# Patient Record
Sex: Male | Born: 1995
Health system: Southern US, Community
[De-identification: ages and names within clinical notes are randomized; demographics above are authoritative.]

## PROBLEM LIST (undated history)

## (undated) DIAGNOSIS — E119 Type 2 diabetes mellitus without complications: Secondary | ICD-10-CM

## (undated) DIAGNOSIS — F840 Autistic disorder: Secondary | ICD-10-CM

## (undated) DIAGNOSIS — H52209 Unspecified astigmatism, unspecified eye: Secondary | ICD-10-CM

## (undated) DIAGNOSIS — E785 Hyperlipidemia, unspecified: Secondary | ICD-10-CM

## (undated) DIAGNOSIS — F8189 Other developmental disorders of scholastic skills: Secondary | ICD-10-CM

## (undated) DIAGNOSIS — L709 Acne, unspecified: Secondary | ICD-10-CM

## (undated) DIAGNOSIS — K0510 Chronic gingivitis, plaque induced: Secondary | ICD-10-CM

## (undated) DIAGNOSIS — N62 Hypertrophy of breast: Secondary | ICD-10-CM

## (undated) DIAGNOSIS — H5210 Myopia, unspecified eye: Secondary | ICD-10-CM

## (undated) DIAGNOSIS — E559 Vitamin D deficiency, unspecified: Secondary | ICD-10-CM

---
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
## 2017-05-19 ENCOUNTER — Encounter (HOSPITAL_COMMUNITY): Payer: Self-pay

## 2017-05-19 ENCOUNTER — Emergency Department (HOSPITAL_COMMUNITY)
Admission: EM | Admit: 2017-05-19 | Discharge: 2017-05-20 | Disposition: A | Discharge status: Home / Self Care | Payer: Medicaid Other | Attending: Emergency Medicine | Admitting: Emergency Medicine

## 2017-05-19 ENCOUNTER — Other Ambulatory Visit: Payer: Self-pay

## 2017-05-19 DIAGNOSIS — Z5321 Procedure and treatment not carried out due to patient leaving prior to being seen by health care provider: Secondary | ICD-10-CM | POA: Insufficient documentation

## 2017-05-19 DIAGNOSIS — K5900 Constipation, unspecified: Secondary | ICD-10-CM | POA: Insufficient documentation

## 2017-05-19 HISTORY — DX: Vitamin D deficiency, unspecified: E55.9

## 2017-05-19 HISTORY — DX: Myopia, unspecified eye: H52.10

## 2017-05-19 HISTORY — DX: Acne, unspecified: L70.9

## 2017-05-19 HISTORY — DX: Hyperlipidemia, unspecified: E78.5

## 2017-05-19 HISTORY — DX: Hypertrophy of breast: N62

## 2017-05-19 HISTORY — DX: Unspecified astigmatism, unspecified eye: H52.209

## 2017-05-19 HISTORY — DX: Chronic gingivitis, plaque induced: K05.10

## 2017-05-19 NOTE — ED Triage Notes (Addendum)
Patient is from a group home. [place] [facility]. Patient was taking a stool softener and was having diarrhea. Group home staff reports that since stopping the stool softener the patient has not had a BM in 5 days. Patient is nonverbal.

## 2017-05-19 NOTE — ED Notes (Signed)
Called Pt in lobby x2 with no response.

## 2017-05-19 NOTE — ED Notes (Signed)
Called pt in lobby no response x2. 

## 2017-05-19 NOTE — ED Notes (Signed)
Called Pt to be roomed, no response x1 

## 2017-05-20 ENCOUNTER — Emergency Department (HOSPITAL_COMMUNITY): Payer: Medicaid Other

## 2017-05-20 ENCOUNTER — Encounter (HOSPITAL_COMMUNITY): Payer: Self-pay | Admitting: Emergency Medicine

## 2017-05-20 ENCOUNTER — Emergency Department (HOSPITAL_COMMUNITY)
Admission: EM | Admit: 2017-05-20 | Discharge: 2017-05-20 | Disposition: A | Discharge status: Home / Self Care | Payer: Medicaid Other | Attending: Emergency Medicine | Admitting: Emergency Medicine

## 2017-05-20 DIAGNOSIS — E119 Type 2 diabetes mellitus without complications: Secondary | ICD-10-CM | POA: Diagnosis not present

## 2017-05-20 DIAGNOSIS — K5900 Constipation, unspecified: Secondary | ICD-10-CM | POA: Insufficient documentation

## 2017-05-20 DIAGNOSIS — Z79899 Other long term (current) drug therapy: Secondary | ICD-10-CM | POA: Diagnosis not present

## 2017-05-20 MED ORDER — POLYETHYLENE GLYCOL 3350 17 GM/SCOOP PO POWD: 17.0000 g | Freq: Every day | ORAL | 0 refills | Status: AC

## 2017-05-20 NOTE — ED Triage Notes (Signed)
Pt brought in by caregiver who reports that patient has had BM since Saturday. Reports that has been on stool softeners for 6 days. They came yesterday and waited over 5 hours but pt became aggressive so they had to leave.

## 2017-05-20 NOTE — ED Provider Notes (Signed)
[facility]-EMERGENCY DEPT Provider Note   CSN: [id number] Arrival date & time: 05/20/17  8295     History   Chief Complaint Chief Complaint  Patient presents with  . Constipation   Level 5 caveat: Nonverbal, autism  HPI Ralph Adams is a 22 y.o. male.  HPI Patient is a 22 year old male presents to the emergency department under the care of his group home provider with complaints of no bowel movement in the past 5-6 days.  Patient is on docusate sodium without improvement.  Continues to eat normally.  Continues to drink normally.  No vomiting reported.  His activity has been normal.  Patient is unable to provide any history.   Past Medical History:  Diagnosis Date  . Acne   . Autism   . Developmental non-verbal disorder   . Diabetes (HCC)   . Dyslipidemia   . Gingivitis   . Gynecomastia   . Myopic astigmatism   . Vitamin D deficiency     Patient Active Problem List   Diagnosis Date Noted  . 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 Tachycardia 11/26/2014  . Agitation 11/26/2014  . Aggressive behavior     History reviewed. No pertinent surgical history.     Home Medications    Prior to Admission medications   Medication Sig Start Date End Date Taking? Authorizing Provider  ARIPiprazole (ABILIFY) 5 MG tablet Take 5 mg by mouth daily. TAKE AT 730 AM   Yes [provider]  Cholecalciferol (VITAMIN D3 PO) Take 2,000 tablets by mouth every morning.   Yes [provider]  Docusate Calcium (STOOL SOFTENER PO) Take 1 capsule by mouth 2 (two) times daily.   Yes [provider]  loratadine (CLARITIN) 10 MG tablet Take 10 mg by mouth daily.   Yes [provider]  Salicylic Acid (ACNE WASH EX) Apply topically. SCRUB AFFECTED AREA(S) OF FACE TWICE DAILY   Yes [provider]  cloNIDine (CATAPRES) 0.1 MG tablet Take 1 tablet (0.1 mg total) by mouth 2 (two) times daily. Patient not taking:  Reported on 05/20/2017 11/27/14   Antoine Primas, MD  OLANZapine zydis (ZYPREXA ZYDIS) 5 MG disintegrating tablet Take 1 tablet (5 mg total) by mouth 2 (two) times daily. Patient not taking: Reported on 05/20/2017 06/19/15   Audry Pili, PA-C  polyethylene glycol powder (MIRALAX) powder Take 17 g by mouth daily. 05/20/17   Azalia Bilis, MD  polyethylene glycol powder (MIRALAX) powder Take 17 g by mouth daily. 05/20/17   Azalia Bilis, MD  risperiDONE (RISPERDAL) 0.5 MG tablet Take 1 tablet (0.5 mg total) by mouth 2 (two) times daily. Patient not taking: Reported on 05/20/2017 06/19/15   Leta Baptist, MD  risperiDONE (RISPERDAL) 0.5 MG tablet Take 1 tablet (0.5 mg total) by mouth 2 (two) times daily. Patient not taking: Reported on 05/20/2017 08/27/15   Barrett, Rolm Gala, PA-C  risperiDONE (RISPERDAL) 0.5 MG tablet Take 1 tablet (0.5 mg total) by mouth 2 (two) times daily. Patient not taking: Reported on 05/20/2017 09/01/15   Doug Sou, MD    Family History No family history on file.  Social History Social History   Tobacco Use  . Smoking status: Never Smoker  . Smokeless tobacco: Never Used  Substance Use Topics  . Alcohol use: No  . Drug use: No     Allergies   Milk-related compounds   Review of Systems Review of Systems  All other systems reviewed and are negative.    Physical Exam  Updated Vital Signs BP 125/84   Pulse 84   Temp 98.4 F (36.9 C) (Oral)   Resp 18   SpO2 100%   Physical Exam  Constitutional: He appears well-developed and well-nourished.  HENT:  Head: Normocephalic.  Eyes: EOM are normal.  Neck: Normal range of motion.  Pulmonary/Chest: Effort normal.  Abdominal: He exhibits no distension. There is no tenderness.  Musculoskeletal: He exhibits no deformity.  Neurological: He is alert.  Psychiatric: He has a normal mood and affect.  Nursing note and vitals reviewed.    ED Treatments / Results  Labs (all labs ordered are listed, but only  abnormal results are displayed) Labs Reviewed - No data to display  EKG  EKG Interpretation None       Radiology Dg Abdomen 1 View  Result Date: 05/20/2017 CLINICAL DATA:  No bowel movement for 1 week, initial encounter EXAM: ABDOMEN - 1 VIEW COMPARISON:  None. FINDINGS: Scattered large and small bowel gas is noted. Mild retained fecal material is noted although no significant obstructive change is seen. No bony abnormality is noted. No free air is seen. IMPRESSION: No definitive obstruction or constipation. Electronically Signed   By: Alcide CleverMark  Lukens M.D.   On: 05/20/2017 11:05    Procedures Procedures (including critical care time)  Medications Ordered in ED Medications - No data to display   Initial Impression / Assessment and Plan / ED Course  I have reviewed the triage vital signs and the nursing notes.  Pertinent labs & imaging results that were available during my care of the patient were reviewed by me and considered in my medical decision making (see chart for details).     Home with [facility].  Primary care follow-up.  Final Clinical Impressions(s) / ED Diagnoses   Final diagnoses:  Constipation, unspecified constipation type    ED Discharge Orders        Ordered    polyethylene glycol powder (MIRALAX) powder  Daily     05/20/17 1156    polyethylene glycol powder (MIRALAX) powder  Daily     05/20/17 1205       Azalia Bilisampos, Kamry Faraci, MD 05/20/17 1206

## 2017-05-20 NOTE — ED Notes (Signed)
Patient transported to X-ray 

## 2017-09-06 ENCOUNTER — Emergency Department (HOSPITAL_COMMUNITY): Payer: Medicaid Other

## 2017-09-06 ENCOUNTER — Encounter (HOSPITAL_COMMUNITY): Payer: Self-pay

## 2017-09-06 ENCOUNTER — Emergency Department (HOSPITAL_COMMUNITY)
Admission: EM | Admit: 2017-09-06 | Discharge: 2017-09-06 | Disposition: A | Discharge status: Home / Self Care | Payer: Medicaid Other | Attending: Emergency Medicine | Admitting: Emergency Medicine

## 2017-09-06 ENCOUNTER — Other Ambulatory Visit: Payer: Self-pay

## 2017-09-06 DIAGNOSIS — E785 Hyperlipidemia, unspecified: Secondary | ICD-10-CM | POA: Diagnosis not present

## 2017-09-06 DIAGNOSIS — S01111A Laceration without foreign body of right eyelid and periocular area, initial encounter: Secondary | ICD-10-CM | POA: Insufficient documentation

## 2017-09-06 DIAGNOSIS — W0110XA Fall on same level from slipping, tripping and stumbling with subsequent striking against unspecified object, initial encounter: Secondary | ICD-10-CM | POA: Diagnosis not present

## 2017-09-06 DIAGNOSIS — E119 Type 2 diabetes mellitus without complications: Secondary | ICD-10-CM | POA: Insufficient documentation

## 2017-09-06 DIAGNOSIS — S0181XA Laceration without foreign body of other part of head, initial encounter: Secondary | ICD-10-CM

## 2017-09-06 DIAGNOSIS — Y9289 Other specified places as the place of occurrence of the external cause: Secondary | ICD-10-CM | POA: Insufficient documentation

## 2017-09-06 DIAGNOSIS — Y9389 Activity, other specified: Secondary | ICD-10-CM | POA: Insufficient documentation

## 2017-09-06 DIAGNOSIS — Z23 Encounter for immunization: Secondary | ICD-10-CM | POA: Insufficient documentation

## 2017-09-06 DIAGNOSIS — Y998 Other external cause status: Secondary | ICD-10-CM | POA: Diagnosis not present

## 2017-09-06 DIAGNOSIS — S0990XA Unspecified injury of head, initial encounter: Secondary | ICD-10-CM | POA: Diagnosis not present

## 2017-09-06 DIAGNOSIS — Z79899 Other long term (current) drug therapy: Secondary | ICD-10-CM | POA: Insufficient documentation

## 2017-09-06 MED ORDER — LORAZEPAM 2 MG/ML IJ SOLN
2.0000 mg | Freq: Once | INTRAMUSCULAR | Status: AC
  Administered 2017-09-06: 2 mg via INTRAMUSCULAR
  Filled 2017-09-06: qty 1

## 2017-09-06 MED ORDER — HALOPERIDOL LACTATE 5 MG/ML IJ SOLN
5.0000 mg | Freq: Once | INTRAMUSCULAR | Status: AC
  Administered 2017-09-06: 5 mg via INTRAMUSCULAR
  Filled 2017-09-06: qty 1

## 2017-09-06 MED ORDER — LIDOCAINE-EPINEPHRINE-TETRACAINE (LET) SOLUTION
3.0000 mL | Freq: Once | NASAL | Status: AC
Start: 2017-09-06 — End: 2017-09-06
  Administered 2017-09-06: 3 mL via TOPICAL
  Filled 2017-09-06: qty 3

## 2017-09-06 MED ORDER — LIDOCAINE-EPINEPHRINE 2 %-1:100000 IJ SOLN
20.0000 mL | Freq: Once | INTRAMUSCULAR | Status: AC
  Administered 2017-09-06: 20 mL
  Filled 2017-09-06: qty 20

## 2017-09-06 MED ORDER — TETANUS-DIPHTH-ACELL PERTUSSIS 5-2.5-18.5 LF-MCG/0.5 IM SUSP
0.5000 mL | Freq: Once | INTRAMUSCULAR | Status: AC
  Administered 2017-09-06: 0.5 mL via INTRAMUSCULAR
  Filled 2017-09-06: qty 0.5

## 2017-09-06 NOTE — Discharge Instructions (Signed)
Take tylenol, motrin for pain.   Continue your current meds.   See your doctor   Return to ER if you have another laceration, fall, headaches, vomiting.
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
## 2017-09-06 NOTE — ED Notes (Signed)
TRIAGE AND SCREEN QUESTIONS ANSWERED BY [facility]. [facility]  MOTHER IS LEGAL GUARDIAN ROSEMARY Ferrebee

## 2017-09-06 NOTE — ED Notes (Signed)
MEDICATION AT BEDSIDE 

## 2017-09-06 NOTE — ED Notes (Signed)
Patient transported to CT 
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
## 2017-09-19 ENCOUNTER — Emergency Department (HOSPITAL_COMMUNITY)
Admission: EM | Admit: 2017-09-19 | Discharge: 2017-09-19 | Disposition: A | Discharge status: Home / Self Care | Payer: Medicaid Other | Attending: Emergency Medicine | Admitting: Emergency Medicine

## 2017-09-19 ENCOUNTER — Encounter (HOSPITAL_COMMUNITY): Payer: Self-pay | Admitting: *Deleted

## 2017-09-19 ENCOUNTER — Other Ambulatory Visit: Payer: Self-pay

## 2017-09-19 DIAGNOSIS — Z79899 Other long term (current) drug therapy: Secondary | ICD-10-CM | POA: Diagnosis not present

## 2017-09-19 DIAGNOSIS — F840 Autistic disorder: Secondary | ICD-10-CM | POA: Diagnosis not present

## 2017-09-19 DIAGNOSIS — Z5189 Encounter for other specified aftercare: Secondary | ICD-10-CM

## 2017-09-19 DIAGNOSIS — H1031 Unspecified acute conjunctivitis, right eye: Secondary | ICD-10-CM | POA: Insufficient documentation

## 2017-09-19 MED ORDER — SULFAMETHOXAZOLE-TRIMETHOPRIM 800-160 MG PO TABS: 1.0000 | ORAL_TABLET | Freq: Two times a day (BID) | ORAL | 0 refills | Status: AC

## 2017-09-19 MED FILL — SULFAMETHOXAZOLE-TMP DS TAB: 800-160 | 7 days supply | Qty: 14 | Fill #0

## 2017-09-19 NOTE — ED Provider Notes (Signed)
[facility]-EMERGENCY DEPT Provider Note   CSN: [id number] Arrival date & time: 09/19/17  1010     History   Chief Complaint Chief Complaint  Patient presents with  . Wound Check    HPI Ralph Adams is a 22 y.o. male.  The history is provided by the patient. No language interpreter was used.  Wound Check  This is a recurrent problem. The current episode started more than 1 week ago. The problem occurs constantly. The problem has been gradually worsening. Nothing aggravates the symptoms. Nothing relieves the symptoms. He has tried nothing for the symptoms.  Pt was seen for a facial laceration on 7/2.  Pt has been picking at wound and at corner of eye.  Pt has drainage and redness from the corner of his eye.   Past Medical History:  Diagnosis Date  . Acne   . Autism   . Developmental non-verbal disorder   . Diabetes (HCC)   . Dyslipidemia   . Gingivitis   . Gynecomastia   . Myopic astigmatism   . Vitamin D deficiency     Patient Active Problem List   Diagnosis Date Noted  . 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 Tachycardia 11/26/2014  . Agitation 11/26/2014  . Aggressive behavior     History reviewed. No pertinent surgical history.      Home Medications    Prior to Admission medications   Medication Sig Start Date End Date Taking? Authorizing Provider  Cholecalciferol (VITAMIN D3 PO) Take 2,000 tablets by mouth every morning.    [provider]  cloNIDine (CATAPRES) 0.1 MG tablet Take 1 tablet (0.1 mg total) by mouth 2 (two) times daily. Patient not taking: Reported on 05/20/2017 11/27/14   Antoine Primas, MD  loratadine (CLARITIN) 10 MG tablet Take 10 mg by mouth daily.    [provider]  OLANZapine zydis (ZYPREXA ZYDIS) 5 MG disintegrating tablet Take 1 tablet (5 mg total) by mouth 2 (two) times daily. Patient not taking: Reported on 05/20/2017 06/19/15   Audry Pili, PA-C  polyethylene glycol powder  (MIRALAX) powder Take 17 g by mouth daily. Patient not taking: Reported on 09/06/2017 05/20/17   Azalia Bilis, MD  polyethylene glycol powder [name]) powder Take 17 g by mouth daily. 05/20/17   Azalia Bilis, MD  QUEtiapine (SEROQUEL XR) 300 MG 24 hr tablet Take 300 mg by mouth at bedtime.    [provider]  risperiDONE (RISPERDAL) 0.5 MG tablet Take 1 tablet (0.5 mg total) by mouth 2 (two) times daily. Patient not taking: Reported on 05/20/2017 06/19/15   Leta Baptist, MD  risperiDONE (RISPERDAL) 0.5 MG tablet Take 1 tablet (0.5 mg total) by mouth 2 (two) times daily. Patient not taking: Reported on 05/20/2017 08/27/15   Barrett, Rolm Gala, PA-C  risperiDONE (RISPERDAL) 0.5 MG tablet Take 1 tablet (0.5 mg total) by mouth 2 (two) times daily. Patient not taking: Reported on 05/20/2017 09/01/15   Doug Sou, MD  Salicylic Acid (ACNE [facility] EX) Apply topically. SCRUB AFFECTED AREA(S) OF FACE TWICE DAILY    [provider]  sulfamethoxazole-trimethoprim (BACTRIM DS,SEPTRA DS) 800-160 MG tablet Take 1 tablet by mouth 2 (two) times daily for 7 days. 09/19/17 09/26/17  Elson Areas, PA-C    Family History No family history on file.  Social History Social History   Tobacco Use  . Smoking status: Never Smoker  . Smokeless tobacco: Never Used  Substance Use Topics  . Alcohol use: No  . Drug  use: No     Allergies   Milk-related compounds   Review of Systems Review of Systems  Eyes: Positive for discharge and redness.  Skin: Positive for wound.       Scabbed laceration above eye  All other systems reviewed and are negative. Pt picking nose during exam.    Physical Exam Updated Vital Signs BP 125/90 (BP Location: Right Arm)   Pulse (!) 125   Resp 20   Ht 5\' 7"  (1.702 m)   Wt 66.7 kg (147 lb)   SpO2 99%   BMI 23.02 kg/m   Physical Exam   ED Treatments / Results  Labs (all labs ordered are listed, but only abnormal results are displayed) Labs Reviewed -  No data to display  EKG None  Radiology No results found.  Procedures Procedures (including critical care time)  Medications Ordered in ED Medications - No data to display   Initial Impression / Assessment and Plan / ED Course  I have reviewed the triage vital signs and the nursing notes.  Pertinent labs & imaging results that were available during my care of the patient were reviewed by me and considered in my medical decision making (see chart for details).     Staff reports pt can not tolerate eye drop  Pt given rx for bactrim An After Visit Summary was printed and given to the patient.   Final Clinical Impressions(s) / ED Diagnoses   Final diagnoses:  Visit for wound check  Acute conjunctivitis of right eye, unspecified acute conjunctivitis type    ED Discharge Orders        Ordered    sulfamethoxazole-trimethoprim (BACTRIM DS,SEPTRA DS) 800-160 MG tablet  2 times daily     09/19/17 1031       Osie CheeksSofia, Esmirna Ravan K, PA-C 09/19/17 1052    Lorre NickAllen, Anthony, MD 09/20/17 [phone]

## 2017-09-19 NOTE — Discharge Instructions (Signed)
Return if any problems.

## 2017-09-19 NOTE — ED Notes (Signed)
ED Provider at bedside. 

## 2017-09-19 NOTE — ED Notes (Signed)
Bed: WTR5 Expected date:  Expected time:  Means of arrival:  Comments: 

## 2017-09-19 NOTE — ED Triage Notes (Signed)
Pt had sutures placed a couple of weeks ago, now has scabs where he picks at area, care giver concerned

## 2017-12-14 ENCOUNTER — Other Ambulatory Visit: Payer: Self-pay

## 2017-12-14 ENCOUNTER — Emergency Department (HOSPITAL_COMMUNITY)
Admission: EM | Admit: 2017-12-14 | Discharge: 2017-12-14 | Disposition: A | Discharge status: Home / Self Care | Payer: Medicaid Other | Attending: Emergency Medicine | Admitting: Emergency Medicine

## 2017-12-14 ENCOUNTER — Encounter (HOSPITAL_COMMUNITY): Payer: Self-pay | Admitting: Emergency Medicine

## 2017-12-14 DIAGNOSIS — R21 Rash and other nonspecific skin eruption: Secondary | ICD-10-CM | POA: Insufficient documentation

## 2017-12-14 DIAGNOSIS — E119 Type 2 diabetes mellitus without complications: Secondary | ICD-10-CM | POA: Diagnosis not present

## 2017-12-14 DIAGNOSIS — R4689 Other symptoms and signs involving appearance and behavior: Secondary | ICD-10-CM | POA: Insufficient documentation

## 2017-12-14 DIAGNOSIS — F840 Autistic disorder: Secondary | ICD-10-CM | POA: Insufficient documentation

## 2017-12-14 DIAGNOSIS — Z79899 Other long term (current) drug therapy: Secondary | ICD-10-CM | POA: Diagnosis not present

## 2017-12-14 LAB — CBC WITH DIFFERENTIAL/PLATELET
Abs Immature Granulocytes: 0.02 10*3/uL (ref 0.00–0.07)
Basophils Absolute: 0.1 10*3/uL (ref 0.0–0.1)
Basophils Relative: 1 %
EOS ABS: 0.3 10*3/uL (ref 0.0–0.5)
EOS PCT: 3 %
HEMATOCRIT: 43.9 % (ref 39.0–52.0)
HEMOGLOBIN: 14.5 g/dL (ref 13.0–17.0)
Immature Granulocytes: 0 %
LYMPHS ABS: 3.1 10*3/uL (ref 0.7–4.0)
LYMPHS PCT: 34 %
MCH: 32.4 pg (ref 26.0–34.0)
MCHC: 33 g/dL (ref 30.0–36.0)
MCV: 98 fL (ref 80.0–100.0)
MONO ABS: 0.7 10*3/uL (ref 0.1–1.0)
Monocytes Relative: 8 %
NRBC: 0 % (ref 0.0–0.2)
Neutro Abs: 5 10*3/uL (ref 1.7–7.7)
Neutrophils Relative %: 54 %
Platelets: 204 10*3/uL (ref 150–400)
RBC: 4.48 MIL/uL (ref 4.22–5.81)
RDW: 11.9 % (ref 11.5–15.5)
WBC: 9.1 10*3/uL (ref 4.0–10.5)

## 2017-12-14 LAB — COMPREHENSIVE METABOLIC PANEL
ALK PHOS: 113 U/L (ref 38–126)
ALT: 31 U/L (ref 0–44)
ANION GAP: 10 (ref 5–15)
AST: 25 U/L (ref 15–41)
Albumin: 4.4 g/dL (ref 3.5–5.0)
BILIRUBIN TOTAL: 0.6 mg/dL (ref 0.3–1.2)
BUN: 13 mg/dL (ref 6–20)
CALCIUM: 9.5 mg/dL (ref 8.9–10.3)
CO2: 27 mmol/L (ref 22–32)
Chloride: 101 mmol/L (ref 98–111)
Creatinine, Ser: 0.81 mg/dL (ref 0.61–1.24)
Glucose, Bld: 89 mg/dL (ref 70–99)
POTASSIUM: 3.4 mmol/L — AB (ref 3.5–5.1)
Sodium: 138 mmol/L (ref 135–145)
TOTAL PROTEIN: 7.5 g/dL (ref 6.5–8.1)

## 2017-12-14 LAB — ETHANOL

## 2017-12-14 LAB — SALICYLATE LEVEL

## 2017-12-14 LAB — ACETAMINOPHEN LEVEL

## 2017-12-14 MED ORDER — DIPHENHYDRAMINE HCL 25 MG PO CAPS
50.0000 mg | ORAL_CAPSULE | Freq: Once | ORAL | Status: AC
  Administered 2017-12-14: 50 mg via ORAL
  Filled 2017-12-14: qty 2

## 2017-12-14 MED ORDER — LORAZEPAM 2 MG/ML IJ SOLN
2.0000 mg | Freq: Once | INTRAMUSCULAR | Status: AC
  Administered 2017-12-14: 2 mg via INTRAMUSCULAR
  Filled 2017-12-14: qty 1

## 2017-12-14 NOTE — ED Notes (Addendum)
Pt. Was given a ham sandwich along with 2 cups of water. Pt. brief was changed due to being soiled. Nurse aware.

## 2017-12-14 NOTE — ED Notes (Signed)
Prior to giving Ativan, patient became agitated. Attempted to throw shoes, and leave the room. Attempted to redirect patient back to room and remain calm.

## 2017-12-14 NOTE — ED Notes (Signed)
Bed: WA23 Expected date:  Expected time:  Means of arrival:  Comments: Triage 7 

## 2017-12-14 NOTE — ED Notes (Addendum)
Patient became agitated and ran out of lobby front door out into road after trying to escort him to a room. Patient convinced to go to room 23 after offering pizza per the caregivers request. Asked if patient had allergies twice, caregiver responded "NO". Pizza given. Afterwards caregiver stated that patient was allergic to milk products. Patient ate one slice, security was able to take the other slice.

## 2017-12-14 NOTE — ED Triage Notes (Addendum)
Rash on face post taking gabapentin 300 mg; started taken medication yesterday. Pt nonverbal; since start has been combative.  Pt swatting at monitoring equipment; unable to obtain VS. Pt NAD.

## 2017-12-14 NOTE — ED Notes (Signed)
Upon being escorted to room, pt became increasingly agitated and ran out of lobby door. Pt is with PD and caregiver outside. Pt has a steady gate

## 2017-12-14 NOTE — ED Provider Notes (Signed)
[facility]-EMERGENCY DEPT Provider Note   CSN: [id number] Arrival date & time: 12/14/17  1247     History   Chief Complaint Chief Complaint  Patient presents with  . Allergic Reaction    HPI Ralph Adams is a 22 y.o. male with h/o autism non verbal brought to ER by group home care giver, Shon Hale, for evaluation of rash and aggressive behavior.  Level 5 caveat due to psychiatric and non verbal developmental disorder.  Per caregiver, pt developed rash around noon today.  Rash is red, dry and pt has been itching and smacking himself in the face.  He became agitated at the same time, kicking things, smacking himself and being uncooperative with staff.  Pt began taking gabapentin yesterday, new medication. Caregiver denies recent fevers, changes in chronic cough, changes in BM.    HPI  Past Medical History:  Diagnosis Date  . Acne   . Autism   . Developmental non-verbal disorder   . Diabetes (HCC)   . Dyslipidemia   . Gingivitis   . Gynecomastia   . Myopic astigmatism   . Vitamin D deficiency     Patient Active Problem List   Diagnosis Date Noted  . 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 Tachycardia 11/26/2014  . Agitation 11/26/2014  . Aggressive behavior     History reviewed. No pertinent surgical history.      Home Medications    Prior to Admission medications   Medication Sig Start Date End Date Taking? Authorizing Provider  Cholecalciferol (VITAMIN D3 PO) Take 2,000 tablets by mouth every morning.   Yes [provider]  clomiPRAMINE (ANAFRANIL) 50 MG capsule Take 50 mg by mouth at bedtime.   Yes [provider]  gabapentin (NEURONTIN) 300 MG capsule Take 300 mg by mouth 2 (two) times daily.   Yes [provider]  loratadine (CLARITIN) 10 MG tablet Take 10 mg by mouth daily.   Yes [provider]  polyethylene glycol powder (MIRALAX) powder Take 17 g by mouth daily. 05/20/17  Yes Azalia Bilis,  MD  propranolol (INDERAL) 40 MG tablet Take 40 mg by mouth 2 (two) times daily.   Yes [provider]  QUEtiapine (SEROQUEL XR) 400 MG 24 hr tablet Take 300 mg by mouth at bedtime.    Yes [provider]  Salicylic Acid (ACNE WASH EX) Apply topically. SCRUB AFFECTED AREA(S) OF FACE TWICE DAILY   Yes [provider]  cloNIDine (CATAPRES) 0.1 MG tablet Take 1 tablet (0.1 mg total) by mouth 2 (two) times daily. Patient not taking: Reported on 12/14/2017 11/27/14   Antoine Primas, MD  OLANZapine zydis (ZYPREXA ZYDIS) 5 MG disintegrating tablet Take 1 tablet (5 mg total) by mouth 2 (two) times daily. Patient not taking: Reported on 12/14/2017 06/19/15   Audry Pili, PA-C  polyethylene glycol powder (MIRALAX) powder Take 17 g by mouth daily. Patient not taking: Reported on 12/14/2017 05/20/17   Azalia Bilis, MD  risperiDONE (RISPERDAL) 0.5 MG tablet Take 1 tablet (0.5 mg total) by mouth 2 (two) times daily. Patient not taking: Reported on 12/14/2017 06/19/15   Leta Baptist, MD  risperiDONE (RISPERDAL) 0.5 MG tablet Take 1 tablet (0.5 mg total) by mouth 2 (two) times daily. Patient not taking: Reported on 12/14/2017 08/27/15   Barrett, Rolm Gala, PA-C  risperiDONE (RISPERDAL) 0.5 MG tablet Take 1 tablet (0.5 mg total) by mouth 2 (two) times daily. Patient not taking: Reported on 12/14/2017 09/01/15   Doug Sou, MD  Family History No family history on file.  Social History Social History   Tobacco Use  . Smoking status: Never Smoker  . Smokeless tobacco: Never Used  Substance Use Topics  . Alcohol use: No  . Drug use: No     Allergies   Lactose intolerance (gi) and Milk-related compounds   Review of Systems Review of Systems  Unable to perform ROS: Patient nonverbal  Skin: Positive for rash.     Physical Exam Updated Vital Signs BP (!) 143/89   Pulse (!) 110   Resp 15   SpO2 98%   Physical Exam  Constitutional: He is oriented to person, place, and  time. He appears well-developed and well-nourished. No distress.  Pacing back and forth in the room. Needs frequent redirection but cooperates with exam.   HENT:  Head: Normocephalic and atraumatic.  Right Ear: External ear normal.  Left Ear: External ear normal.  Nose: Nose normal.  No nasal mucosal edema. No obvious soft palette, uvula, oropharynx, tonsil, tongue, lip or facial angioedema.  MMM. No intraoral lesions.   Eyes: Conjunctivae and EOM are normal.  Neck: Normal range of motion. Neck supple.  Cardiovascular: Normal rate, regular rhythm and normal heart sounds.  Pulmonary/Chest: Effort normal and breath sounds normal.  Musculoskeletal: Normal range of motion. He exhibits no deformity.  Neurological: He is alert and oriented to person, place, and time.  Skin: Skin is warm and dry. Capillary refill takes less than 2 seconds.  Dry, papular, erythematous rash to central face with excoriations and dry blood.  No rash to anterior/posterior thorax, abdomen, upper or lower extremities.   Psychiatric: He has a normal mood and affect. His behavior is normal. Judgment and thought content normal.  Nursing note and vitals reviewed.    ED Treatments / Results  Labs (all labs ordered are listed, but only abnormal results are displayed) Labs Reviewed  COMPREHENSIVE METABOLIC PANEL - Abnormal; Notable for the following components:      Result Value   Potassium 3.4 (*)    All other components within normal limits  ACETAMINOPHEN LEVEL - Abnormal; Notable for the following components:   Acetaminophen (Tylenol), Serum <10 (*)    All other components within normal limits  CBC WITH DIFFERENTIAL/PLATELET  ETHANOL  SALICYLATE LEVEL    EKG None  Radiology No results found.  Procedures Procedures (including critical care time)  Medications Ordered in ED Medications  diphenhydrAMINE (BENADRYL) capsule 50 mg (50 mg Oral Given 12/14/17 1443)  LORazepam (ATIVAN) injection 2 mg (2 mg  Intramuscular Given 12/14/17 1435)     Initial Impression / Assessment and Plan / ED Course  I have reviewed the triage vital signs and the nursing notes.  Pertinent labs & imaging results that were available during my care of the patient were reviewed by me and considered in my medical decision making (see chart for details).  Clinical Course as of Dec 14 1625  Wed Dec 14, 2017  1615 Re-evaluated pt.  Rash has not worsened or spread.  Throat exam unchanged. VSS.  Caregiver states pt has been more calm and cooperative, eating and drinking.    [CG]    Clinical Course User Index [CG] Liberty Handy, PA-C   22 yo here with dry, papular, erythematous, central face rash after recent initiation of gabapentin.  Followed with agitation, aggression, smacking himself in the face.  Rash is most likely contact dermatitis vs reaction from gabapentin.  He keeps rubbing his nose and mouth during exam.  Pt was given benadryl and observed in ER without worsening rash or other signs of severe allergic reaction, anaphylaxis.  No intraoral lesions. NO recent antibiotics. Dermatological emergency such as EM, SJS considered unlike.y He is tolerating PO. He has been more calm. Screening labs normal. VSS.  Caregiver agrees he is more calm as well.  I think rash is itchy and pt is bothered by it, this may have triggered behavioral changes.  Caregiver denies infectious symptoms, WBC normal.  Lungs CTAB. No recent diarrhea, vomiting.  Rash does not appear to be cellulitic.  I think pt is appropriate for discharge.  Recommended dc gabapentin and f/u with psychiatrist for re-evaluation and medication adjustment.  Recommended benadryl PO, benadryl vs hydrocortisone cream, moisturizer and mild facial cleanser. Discussed specific return precautions with pt's caregiver.    Final Clinical Impressions(s) / ED Diagnoses   Final diagnoses:  Facial rash  Aggressive behavior of adult    ED Discharge Orders    None         Liberty Handy, PA-C 12/14/17 1627    Wynetta Fines, MD 12/14/17 [phone]

## 2017-12-14 NOTE — Discharge Instructions (Addendum)
You were seen in the ER for a facial rash and aggression.  The rash may have been due to a topical allergen and/or reaction to gabapentin.  Take Benadryl 25 mg every 6-8 hours for the next 3 days to help with rash and itching.  You can apply Benadryl cream and hydrocortisone cream to the rash to help with itching.  Avoid irritating facial cleansers. You can purchase Cerave Gentle cream cleanser to use daily.   Return to the ER if there is facial, eyelid, lip, tongue swelling, vomiting, diarrhea, difficulty breathing, worsening rash.

## 2017-12-14 NOTE — ED Notes (Signed)
Bed: WTR7 Expected date:  Expected time:  Means of arrival:  Comments: 

## 2018-01-10 ENCOUNTER — Emergency Department (HOSPITAL_COMMUNITY)
Admission: EM | Admit: 2018-01-10 | Discharge: 2018-01-12 | Disposition: A | Discharge status: Home / Self Care | Payer: Medicaid Other | Attending: Emergency Medicine | Admitting: Emergency Medicine

## 2018-01-10 ENCOUNTER — Encounter (HOSPITAL_COMMUNITY): Payer: Self-pay | Admitting: *Deleted

## 2018-01-10 DIAGNOSIS — F73 Profound intellectual disabilities: Secondary | ICD-10-CM | POA: Insufficient documentation

## 2018-01-10 DIAGNOSIS — Z046 Encounter for general psychiatric examination, requested by authority: Secondary | ICD-10-CM

## 2018-01-10 DIAGNOSIS — F840 Autistic disorder: Secondary | ICD-10-CM | POA: Insufficient documentation

## 2018-01-10 DIAGNOSIS — E119 Type 2 diabetes mellitus without complications: Secondary | ICD-10-CM | POA: Diagnosis not present

## 2018-01-10 DIAGNOSIS — Z79899 Other long term (current) drug therapy: Secondary | ICD-10-CM | POA: Insufficient documentation

## 2018-01-10 DIAGNOSIS — R456 Violent behavior: Secondary | ICD-10-CM | POA: Diagnosis present

## 2018-01-10 DIAGNOSIS — R4689 Other symptoms and signs involving appearance and behavior: Secondary | ICD-10-CM | POA: Diagnosis present

## 2018-01-10 DIAGNOSIS — F329 Major depressive disorder, single episode, unspecified: Secondary | ICD-10-CM | POA: Diagnosis not present

## 2018-01-10 DIAGNOSIS — X838XXA Intentional self-harm by other specified means, initial encounter: Secondary | ICD-10-CM | POA: Diagnosis not present

## 2018-01-10 NOTE — ED Notes (Signed)
Bed: WA31 Expected date:  Expected time:  Means of arrival:  Comments: 

## 2018-01-10 NOTE — ED Provider Notes (Signed)
[facility]-EMERGENCY DEPT Provider Note   CSN: [id number] Arrival date & time: 01/10/18  2216     History   Chief Complaint Chief Complaint  Patient presents with  . IVC  . Aggressive Behavior    HPI Ralph Adams is a 22 y.o. male.  The history is provided by medical records and the police.     Level 5 caveat: Patient nonverbal  22 year old male with history of autism, developmental disorder, diabetes, dyslipidemia, vitamin D D deficiency, astigmatism, presenting to the ED under IVC petition by his group home.  Patient has apparently been disruptive at the group home and has been wiping his feces all over the kitchen, hitting other residents of the group home and destroying some property.  Past Medical History:  Diagnosis Date  . Acne   . Autism   . Developmental non-verbal disorder   . Diabetes (HCC)   . Dyslipidemia   . Gingivitis   . Gynecomastia   . Myopic astigmatism   . Vitamin D deficiency     Patient Active Problem List   Diagnosis Date Noted  . 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 Tachycardia 11/26/2014  . Agitation 11/26/2014  . Aggressive behavior     History reviewed. No pertinent surgical history.      Home Medications    Prior to Admission medications   Medication Sig Start Date End Date Taking? Authorizing Provider  Cholecalciferol (VITAMIN D3 PO) Take 2,000 tablets by mouth every morning.    [provider]  clomiPRAMINE (ANAFRANIL) 50 MG capsule Take 50 mg by mouth at bedtime.    [provider]  cloNIDine (CATAPRES) 0.1 MG tablet Take 1 tablet (0.1 mg total) by mouth 2 (two) times daily. Patient not taking: Reported on 12/14/2017 11/27/14   Antoine Primas, MD  gabapentin (NEURONTIN) 300 MG capsule Take 300 mg by mouth 2 (two) times daily.    [provider]  loratadine (CLARITIN) 10 MG tablet Take 10 mg by mouth daily.    [provider]  OLANZapine zydis (ZYPREXA  ZYDIS) 5 MG disintegrating tablet Take 1 tablet (5 mg total) by mouth 2 (two) times daily. Patient not taking: Reported on 12/14/2017 06/19/15   Audry Pili, PA-C  polyethylene glycol powder (MIRALAX) powder Take 17 g by mouth daily. 05/20/17   Azalia Bilis, MD  polyethylene glycol powder (MIRALAX) powder Take 17 g by mouth daily. Patient not taking: Reported on 12/14/2017 05/20/17   Azalia Bilis, MD  propranolol (INDERAL) 40 MG tablet Take 40 mg by mouth 2 (two) times daily.    [provider]  QUEtiapine (SEROQUEL XR) 400 MG 24 hr tablet Take 300 mg by mouth at bedtime.     [provider]  risperiDONE (RISPERDAL) 0.5 MG tablet Take 1 tablet (0.5 mg total) by mouth 2 (two) times daily. Patient not taking: Reported on 12/14/2017 06/19/15   Leta Baptist, MD  risperiDONE (RISPERDAL) 0.5 MG tablet Take 1 tablet (0.5 mg total) by mouth 2 (two) times daily. Patient not taking: Reported on 12/14/2017 08/27/15   Barrett, Rolm Gala, PA-C  risperiDONE (RISPERDAL) 0.5 MG tablet Take 1 tablet (0.5 mg total) by mouth 2 (two) times daily. Patient not taking: Reported on 12/14/2017 09/01/15   Doug Sou, MD  Salicylic Acid (ACNE [facility] EX) Apply topically. SCRUB AFFECTED AREA(S) OF FACE TWICE DAILY    [provider]    Family History No family history on file.  Social History Social History   Tobacco  Use  . Smoking status: Never Smoker  . Smokeless tobacco: Never Used  Substance Use Topics  . Alcohol use: No  . Drug use: No     Allergies   Lactose intolerance (gi) and Milk-related compounds   Review of Systems Review of Systems  Unable to perform ROS: Patient nonverbal     Physical Exam Updated Vital Signs BP 92/65 (BP Location: Right Arm)   Pulse (!) 52   Temp (!) 97.4 F (36.3 C) (Axillary)   Resp 16   SpO2 100%   Physical Exam  Constitutional: He appears well-developed and well-nourished.  Sitting on bed, watching cartoons, calm  HENT:  Head:  Normocephalic and atraumatic.  Mouth/Throat: Oropharynx is clear and moist.  Eyes: Pupils are equal, round, and reactive to light. Conjunctivae and EOM are normal.  Neck: Normal range of motion.  Cardiovascular: Normal rate, regular rhythm and normal heart sounds.  Pulmonary/Chest: Effort normal and breath sounds normal.  Abdominal: Soft. Bowel sounds are normal.  Musculoskeletal: Normal range of motion.  Neurological: He is alert.  Awake, moving extremities well, ambulatory with steady gait, does not provide any verbal responses to questions, follows limited commands  Skin: Skin is warm and dry.  Psychiatric: He has a normal mood and affect.  Nursing note and vitals reviewed.    ED Treatments / Results  Labs (all labs ordered are listed, but only abnormal results are displayed) Labs Reviewed  CBC WITH DIFFERENTIAL/PLATELET - Abnormal; Notable for the following components:      Result Value   RBC 4.09 (*)    All other components within normal limits  COMPREHENSIVE METABOLIC PANEL - Abnormal; Notable for the following components:   Potassium 3.4 (*)    BUN 21 (*)    All other components within normal limits  ACETAMINOPHEN LEVEL - Abnormal; Notable for the following components:   Acetaminophen (Tylenol), Serum <10 (*)    All other components within normal limits  SALICYLATE LEVEL  ETHANOL  RAPID URINE DRUG SCREEN, HOSP PERFORMED    EKG None  Radiology No results found.  Procedures Procedures (including critical care time)  Medications Ordered in ED Medications - No data to display   Initial Impression / Assessment and Plan / ED Course  I have reviewed the triage vital signs and the nursing notes.  Pertinent labs & imaging results that were available during my care of the patient were reviewed by me and considered in my medical decision making (see chart for details).  22 y.o. Judie Petit here under IVC petitioned by group home.  Apparently patient with some aggressive  behavior there and has been involved in several altercations.  He is non-verbal and does not provide any additional history for me.  He is not aggressive, was calm during examination.  Vitals stable.  Labs reassuring.  Patient medically cleared.  TTS evaluation pending.  TTS has evaluated, recommends overnight observation and reassessment by psychiatry in the morning.  Home meds ordered.  Patient has remained stable.    Final Clinical Impressions(s) / ED Diagnoses   Final diagnoses:  Involuntary commitment    ED Discharge Orders    None       Garlon Hatchet, PA-C 01/11/18 1610    Raeford Razor, MD 01/20/18 [phone]

## 2018-01-10 NOTE — ED Triage Notes (Signed)
Pt brought in by [employer] under IVC d/t aggressive behavior @ the group home.  Per IVC papers, pt has been wiping feces all over the kitchen, hitting other residents and destroying property.

## 2018-01-11 ENCOUNTER — Other Ambulatory Visit: Payer: Self-pay

## 2018-01-11 LAB — CBC WITH DIFFERENTIAL/PLATELET
Abs Immature Granulocytes: 0.02 10*3/uL (ref 0.00–0.07)
Basophils Absolute: 0.1 10*3/uL (ref 0.0–0.1)
Basophils Relative: 1 %
EOS ABS: 0.2 10*3/uL (ref 0.0–0.5)
EOS PCT: 2 %
HEMATOCRIT: 39.2 % (ref 39.0–52.0)
HEMOGLOBIN: 13.1 g/dL (ref 13.0–17.0)
Immature Granulocytes: 0 %
LYMPHS ABS: 3.9 10*3/uL (ref 0.7–4.0)
LYMPHS PCT: 42 %
MCH: 32 pg (ref 26.0–34.0)
MCHC: 33.4 g/dL (ref 30.0–36.0)
MCV: 95.8 fL (ref 80.0–100.0)
MONO ABS: 0.6 10*3/uL (ref 0.1–1.0)
MONOS PCT: 7 %
NRBC: 0 % (ref 0.0–0.2)
Neutro Abs: 4.5 10*3/uL (ref 1.7–7.7)
Neutrophils Relative %: 48 %
Platelets: 177 10*3/uL (ref 150–400)
RBC: 4.09 MIL/uL — ABNORMAL LOW (ref 4.22–5.81)
RDW: 12.3 % (ref 11.5–15.5)
WBC: 9.3 10*3/uL (ref 4.0–10.5)

## 2018-01-11 LAB — ACETAMINOPHEN LEVEL: Acetaminophen (Tylenol), Serum: <10 ug/mL — ABNORMAL LOW (ref 10–30)

## 2018-01-11 LAB — COMPREHENSIVE METABOLIC PANEL
ALK PHOS: 109 U/L (ref 38–126)
ALT: 26 U/L (ref 0–44)
ANION GAP: 8 (ref 5–15)
AST: 21 U/L (ref 15–41)
Albumin: 4 g/dL (ref 3.5–5.0)
BILIRUBIN TOTAL: 0.6 mg/dL (ref 0.3–1.2)
BUN: 21 mg/dL — ABNORMAL HIGH (ref 6–20)
CO2: 27 mmol/L (ref 22–32)
Calcium: 9.2 mg/dL (ref 8.9–10.3)
Chloride: 102 mmol/L (ref 98–111)
Creatinine, Ser: 0.95 mg/dL (ref 0.61–1.24)
GFR calc Af Amer: >60 mL/min (ref >60)
GLUCOSE: 90 mg/dL (ref 70–99)
POTASSIUM: 3.4 mmol/L — AB (ref 3.5–5.1)
Sodium: 137 mmol/L (ref 135–145)
TOTAL PROTEIN: 6.8 g/dL (ref 6.5–8.1)

## 2018-01-11 LAB — SALICYLATE LEVEL: Salicylate Lvl: <7 mg/dL (ref 2.8–30.0)

## 2018-01-11 LAB — ETHANOL: Alcohol, Ethyl (B): <10 mg/dL (ref <10)

## 2018-01-11 MED ORDER — VITAMIN D3 25 MCG (1000 UNIT) PO TABS
2000.0000 [IU] | ORAL_TABLET | Freq: Every morning | ORAL | Status: DC
  Administered 2018-01-11 – 2018-01-12 (×2): 2000 [IU] via ORAL
  Filled 2018-01-11 (×2): qty 2

## 2018-01-11 MED ORDER — CLOMIPRAMINE HCL 25 MG PO CAPS
50.0000 mg | ORAL_CAPSULE | Freq: Every day | ORAL | Status: DC
  Administered 2018-01-11: 50 mg via ORAL
  Filled 2018-01-11: qty 2

## 2018-01-11 MED ORDER — ZIPRASIDONE MESYLATE 20 MG IM SOLR
10.0000 mg | Freq: Once | INTRAMUSCULAR | Status: DC
  Filled 2018-01-11: qty 20

## 2018-01-11 MED ORDER — DIPHENHYDRAMINE HCL 25 MG PO CAPS
25.0000 mg | ORAL_CAPSULE | Freq: Three times a day (TID) | ORAL | Status: DC
  Administered 2018-01-11 – 2018-01-12 (×4): 25 mg via ORAL
  Filled 2018-01-11 (×4): qty 1

## 2018-01-11 MED ORDER — PROPRANOLOL HCL 40 MG PO TABS
40.0000 mg | ORAL_TABLET | Freq: Two times a day (BID) | ORAL | Status: DC
  Administered 2018-01-11 – 2018-01-12 (×3): 40 mg via ORAL
  Filled 2018-01-11 (×3): qty 1

## 2018-01-11 MED ORDER — QUETIAPINE FUMARATE ER 200 MG PO TB24
400.0000 mg | ORAL_TABLET | Freq: Two times a day (BID) | ORAL | Status: DC
  Administered 2018-01-11 – 2018-01-12 (×3): 400 mg via ORAL
  Filled 2018-01-11 (×3): qty 2

## 2018-01-11 MED ORDER — LORATADINE 10 MG PO TABS
10.0000 mg | ORAL_TABLET | Freq: Every day | ORAL | Status: DC
  Administered 2018-01-11 – 2018-01-12 (×2): 10 mg via ORAL
  Filled 2018-01-11 (×2): qty 1

## 2018-01-11 MED ORDER — ZIPRASIDONE MESYLATE 20 MG IM SOLR
10.0000 mg | Freq: Once | INTRAMUSCULAR | Status: AC
  Administered 2018-01-11: 10 mg via INTRAMUSCULAR

## 2018-01-11 MED ORDER — POLYETHYLENE GLYCOL 3350 17 G PO PACK
17.0000 g | PACK | Freq: Every day | ORAL | Status: DC
  Administered 2018-01-11 – 2018-01-12 (×2): 17 g via ORAL
  Filled 2018-01-11 (×2): qty 1

## 2018-01-11 MED ORDER — STERILE WATER FOR INJECTION IJ SOLN
INTRAMUSCULAR | Status: AC
  Filled 2018-01-11: qty 10

## 2018-01-11 NOTE — BH Assessment (Signed)
[facility] Assessment Progress Note  Per Juanetta Beets, DO, this pt requires psychiatric hospitalization at a facility providing specialty services for IDD patients at this time.  Pt presents under IVC initiated by pt's group home staff, which Dr Sharma Covert has upheld.  This Clinical research associate called [facility] and spoke to [place], who reports that no beds are available on their IDD unit today.  Pt will remain at [facility] pending placement.  Archie Balboa, LCSW agrees to contact pt's guardian and facility to update them on pt's disposition.  Pt's nurse has been notified.  Doylene Canning Behavioral Health Coordinator [phone]

## 2018-01-11 NOTE — Progress Notes (Signed)
CSW aware patient is being recommended for psychiatric inpatient treatment. CSW attempted to reach out to patient's legal guardian, Cassell Voorhies [phone], regarding patient disposition. CSW left voicemail for return call. CSW also attempted to reach out to patient's IDD care coordinator, Val Eagle [phone]. CSW also left voicemail for return call.  CSW spoke with Juline Patch, Director of [facility] [phone]. CSW explained that patient currently meets criteria for inpatient psych treatment. CSW explained that if while patient is awaiting placement, patient stabilizes then he may be discharged back home before placement is found.  CSW to await return calls.  Archie Balboa, LCSWA  Clinical Social Work Department  [employer]  [phone]

## 2018-01-11 NOTE — ED Notes (Signed)
Pt continues to slip out of wrist restraints.

## 2018-01-11 NOTE — ED Notes (Signed)
Pt alert . Pt sitting in chair in doorway is calm and relaxed and is cooperative and responds to command.

## 2018-01-11 NOTE — ED Notes (Signed)
Pt became agitated and restless. Pt began to run around unit attempting to elope, pt was not responsive to verbal redirection and became aggressive with arms swing and darting behavior. Security called and maintain pt and staff safety while keeping pt in room Dr. Rubin Payor made aware and place order for Geodon 10 mg, and 4 point restraint.

## 2018-01-11 NOTE — BH Assessment (Signed)
Tele Assessment Note   Patient Name: Ralph Adams MRN: [id number] Referring Physician: Sharilyn Sites, PA Location of Patient: [place]    [id number] Location of Provider: Behavioral Health TTS Department  Ralph Adams is an 22 y.o. male, dx Autism Spectrum, Profound IDD, non verbal developmental disorder, brought in by [employer] under IVC d/t aggressive behavior @ the group home.  Per IVC papers, pt has been wiping feces all over the kitchen, hitting other residents and destroying property. Patient is nonverbal.  Patient resides at group home called [facility], [place] [phone]. Legal guardian is [name], mother at [phone]. Possible medication adjustment needed. Per group home staff, patient is seeing Dr. Sherwood Gambler, psychiatrist.   Collateral Contact: Spoke with Juline Patch, staff owner of [facility], [place]. [name] stated patient has been at group home since 04/2017, prior to that patient was at [facility]. [name] reports that behaviors worsened within the past week. Patient was instigating fights with other housemates by going into their room and taking objects. Patient has been to ED in the past times for same behaviors, last time being 12/14/17. [name] is willing to take patient back into [facility], [place]. [name] reported that patient has ongoing tendencies to harm self and others. [name] reported patient is currently seeing Dr. Sherwood Gambler, psychiatrist.   PER IVC RESPONDENT HAS BEEN DIAGNOSED WITH AUTISTIC DISORDER HYPERCHOLESTEROLEMA- PROFOUND IDD, AND SELF HARM DEPRESSION. RESPONDENT IS PRESCRIBED LORATADINE 10 MG BANOPHEN 25 MG CLOMPTAMINE 50MG  QUETIAPINE ER 400MG  AND PROPRANOLOLOL 40MG  RESPONDENT IS ACTING AGGRESSIVE AND HOSTILE TOWARDS STAFF AND HOUSE MATES RESPONDENT IS KICKING, BITING, HEAD BUTTING. RESPONDENT IS SMASHING WINDOW, DOORS, TABLES, WALLS. RESPONDENT WAS SPREADING FECES ALL OVER THE KITCHEN.   Diagnosis: Autism Spectrum, Profound IDD, Self harm  Depression  Past Medical History:  Past Medical History:  Diagnosis Date  . Acne   . Autism   . Developmental non-verbal disorder   . Diabetes (HCC)   . Dyslipidemia   . Gingivitis   . Gynecomastia   . Myopic astigmatism   . Vitamin D deficiency     History reviewed. No pertinent surgical history.  Family History: No family history on file.  Social History:  reports that he has never smoked. He has never used smokeless tobacco. He reports that he does not drink alcohol or use drugs.  Additional Social History:  Alcohol / Drug Use Pain Medications: see MAR Prescriptions: see MAR Over the Counter: see MAR  CIWA: CIWA-Ar BP: 92/65 Pulse Rate: (!) 52 COWS:    Allergies:  Allergies  Allergen Reactions  . Lactose Intolerance (Gi)   . Milk-Related Compounds Diarrhea    Home Medications:  (Not in a hospital admission)  OB/GYN Status:  No LMP for male patient.  General Assessment Data Assessment unable to be completed: Yes Reason for not completing assessment: (patient nonverbal) Location of Assessment: [facility] ED TTS Assessment: In system Is this a Tele or Face-to-Face Assessment?: Face-to-Face Is this an Initial Assessment or a Re-assessment for this encounter?: Initial Assessment Patient Accompanied by:: N/A Language Other than English: No Living Arrangements: In Group Home: (Comment: Name of Group Home)([facility]) What gender do you identify as?: Male Marital status: Single Living Arrangements: Group Home([facility], [facility] [name] [phone]) Can pt return to current living arrangement?: Yes Admission Status: Involuntary Petitioner: [name], [place], [phone]) Is patient capable of signing voluntary admission?: No(IVC) Referral Source: Other(group home)     Crisis Care Plan Living Arrangements: Group Home([facility], [place]  [name] [phone]) Legal Guardian: Mother([name], mother, [phone]) Name of  Psychiatrist: (Dr. Sherwood Gambler) Name of Therapist: (none)  Education Status Is patient currently in school?: No Is the patient employed, unemployed or receiving disability?: Receiving disability income, Unemployed  Risk to self with the past 6 months Suicidal Ideation: (unable to assess) Has patient been a risk to self within the past 6 months prior to admission? : (unable to assess) Suicidal Intent: (unable to assess) Has patient had any suicidal intent within the past 6 months prior to admission? : (unable to assess) Is patient at risk for suicide?: (unable to assess) Suicidal Plan?: (unable to assess) Has patient had any suicidal plan within the past 6 months prior to admission? : (unable to assess) Access to Means: (unable to assess) What has been your use of drugs/alcohol within the last 12 months?: (unable to assess) Previous Attempts/Gestures: (unable to assess) Other Self Harm Risks: (banging head) Triggers for Past Attempts: Unknown Intentional Self Injurious Behavior: None Family Suicide History: Unable to assess Recent stressful life event(s): (unable to assess) Persecutory voices/beliefs?: (unable to assess) Depression: (unable to assess) Depression Symptoms: (unable to assess) Substance abuse history and/or treatment for substance abuse?: (none per Group Home staff)  Risk to Others within the past 6 months Homicidal Ideation: (unable to assess) Does patient have any lifetime risk of violence toward others beyond the six months prior to admission? : Yes (comment) Thoughts of Harm to Others: Yes-Currently Present Comment - Thoughts of Harm to Others: (kicking, biting and head butting others) Current Homicidal Intent: (unable to assess) Current Homicidal Plan: (unable to assess) Access to Homicidal Means: (unable to assess) History of harm to others?: Yes Assessment of Violence: None Noted Violent Behavior Description: (kicking biting headbutting smashing windows doors  tables) Does patient have access to weapons?: No Criminal Charges Pending?: No Does patient have a court date: No Is patient on probation?: No  Psychosis Hallucinations: (unable to assess) Delusions: (unable to assess)  Mental Status Report Appearance/Hygiene: Unremarkable Eye Contact: Unable to Assess Speech: (non verbal) Level of Consciousness: Alert Mood: Sad Affect: Sad Anxiety Level: Minimal Thought Processes: Unable to Assess Judgement: Unable to Assess Orientation: Unable to assess Obsessive Compulsive Thoughts/Behaviors: Unable to Assess  Cognitive Functioning Concentration: Unable to Assess Memory: Unable to Assess Is patient IDD: Yes Level of Function: (Profound IDD) Is IQ score available?: No Insight: Unable to Assess Impulse Control: Poor Appetite: Good Have you had any weight changes? : No Change Sleep: (up and down) Vegetative Symptoms: Unable to Assess  ADLScreening [facility] Assessment Services) Patient's cognitive ability adequate to safely complete daily activities?: No Patient able to express need for assistance with ADLs?: Yes Independently performs ADLs?: Yes (appropriate for developmental age)  Prior Inpatient Therapy Prior Inpatient Therapy: Yes Prior Therapy Dates: (04/2017) Prior Therapy Facilty/Provider(s): [facility]) Reason for Treatment: (mental health)  Prior Outpatient Therapy Prior Outpatient Therapy: Yes Prior Therapy Dates: (present) Prior Therapy Facilty/Provider(s): (Dr. Melene Plan, psychiatris) Reason for Treatment: (mental health) Does patient have an ACCT team?: No Does patient have Intensive In-House Services?  : No Does patient have [facility] services? : No Does patient have P4CC services?: No  ADL Screening (condition at time of admission) Patient's cognitive ability adequate to safely complete daily activities?: No Patient able to express need for assistance with ADLs?: Yes Independently performs ADLs?: Yes  (appropriate for developmental age)               Disposition Initial Assessment Completed for this Encounter: Yes  Nira Conn, NP, recommends overnight observation for safety and stabilization due patient being nonverbal, reevaluate by psychiatry in the morning. RN informed of disposition.  This service was provided via telemedicine using a 2-way, interactive audio and video technology.  Names of all persons participating in this telemedicine service and their role in this encounter. Name: Ralph Adams Role: patient  Name: Al Corpus, [facility] Role: TTS Clinician  Name:  Role:   Name:  Role:     Burnetta Sabin 01/11/2018 3:06 AM

## 2018-01-11 NOTE — ED Provider Notes (Signed)
  Physical Exam  BP 94/77 (BP Location: Right Arm)   Pulse 97   Temp 97.7 F (36.5 C) (Oral)   Resp 18   SpO2 100%   Physical Exam  ED Course/Procedures     Procedures  MDM  Patient became more agitated.  Running around the ER.  We will give some Geodon to help calm down.  Nurse does not think he would be able to take orals at this time.       Benjiman Core, MD 01/11/18 1416

## 2018-01-11 NOTE — ED Notes (Signed)
Nira Conn, NP, recommends overnight observation for safety and stabilization due patient being nonverbal, reevaluate by psychiatry in the morning. RN informed of disposition.

## 2018-01-12 DIAGNOSIS — Z046 Encounter for general psychiatric examination, requested by authority: Secondary | ICD-10-CM

## 2018-01-12 DIAGNOSIS — R4689 Other symptoms and signs involving appearance and behavior: Secondary | ICD-10-CM

## 2018-01-12 NOTE — Progress Notes (Signed)
CSW aware patient has been psychiatrically and medically cleared for discharge. CSW reached out to patient's legal guardian/mother, Kalani Sthilaire [phone], regarding patient disposition. Rosemary expressed understanding and is agreeable to patient discharging back to group home. CSW spoke to director of patient's group home, Lymus [name] [phone], to inform him patient was ready for discharge. Per Lymus, he would not be able to come pick up patient until 3:30pm. CSW to update patient's RN.   Archie Balboa, LCSWA  Clinical Social Work Department  [employer]  [phone]

## 2018-01-12 NOTE — ED Notes (Signed)
Patient sleeping

## 2018-01-12 NOTE — Consult Note (Addendum)
[facility] ED Discharge  01/12/2018 12:27 PM Ralph Adams  MRN:  [id number] Principal Problem: Aggressive behavior Discharge Diagnoses:  Patient Active Problem List   Diagnosis Date Noted  . 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 Tachycardia [R00.0] 11/26/2014  . Agitation [R45.1] 11/26/2014  . Aggressive behavior [R46.89]     Subjective: Pt was seen and chart reviewed with treatment team and Dr Sharma Covert. 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 Contact at the group home is Ralph Adams at [phone]. They are willing for him to retuen there once he stabilizes. His guardian is his mother, Ralph Adams at [phone]. Pt was acting aggressive at the group home and was placed under IVC. Pt sees a psychiatrist, Dr Jannifer Franklin for medication management. Pt has been calm and cooperative overnight and remained calm this morning during AM rounds. Pt is medication adherent at the group home. Pt is psychiatrically clear for return to his group home at this time.   Total Time spent with patient: 30 minutes  Past Psychiatric History: As above  Past Medical History:  Past Medical History:  Diagnosis Date  . Acne   . Autism   . Developmental non-verbal disorder   . Diabetes (HCC)   . Dyslipidemia   . Gingivitis   . Gynecomastia   . Myopic astigmatism   . Vitamin D deficiency    History reviewed. No pertinent surgical history. Family History: No family history on file. Family Psychiatric  History: Pt is unable to provide this information Social History:  Social History   Substance and Sexual Activity  Alcohol Use No    Social History   Substance and Sexual Activity  Drug Use No   Social History   Socioeconomic History  . Marital status: Single    Spouse name: Not on file  . Number of children: Not on file  . Years of education: Not on file  . Highest education level: Not on file  Occupational History  . Not on file  Social Needs  .  Financial resource strain: Not on file  . Food insecurity:    Worry: Not on file    Inability: Not on file  . Transportation needs:    Medical: Not on file    Non-medical: Not on file  Tobacco Use  . Smoking status: Never Smoker  . Smokeless tobacco: Never Used  Substance and Sexual Activity  . Alcohol use: No  . Drug use: No  . Sexual activity: Not on file  Lifestyle  . Physical activity:    Days per week: Not on file    Minutes per session: Not on file  . Stress: Not on file  Relationships  . Social connections:    Talks on phone: Not on file    Gets together: Not on file    Attends religious service: Not on file    Active member of club or organization: Not on file    Attends meetings of clubs or organizations: Not on file    Relationship status: Not on file  Other Topics Concern  . Not on file  Social History Narrative  . Not on file    Has this patient used any form of tobacco in the last 30 days? (Cigarettes, Smokeless Tobacco, Cigars, and/or Pipes) Prescription not provided because: Pt does not smoke  Current Medications: Current Facility-Administered Medications  Medication Dose Route Frequency Provider Last Rate Last Dose  . cholecalciferol (VITAMIN D) tablet 2,000 Units  2,000  Units Oral q morning - 10a Garlon Hatchet, PA-C   2,000 Units at 01/12/18 0935  . clomiPRAMINE (ANAFRANIL) capsule 50 mg  50 mg Oral QHS Garlon Hatchet, PA-C   50 mg at 01/11/18 2256  . diphenhydrAMINE (BENADRYL) capsule 25 mg  25 mg Oral TID Garlon Hatchet, PA-C   25 mg at 01/12/18 0935  . loratadine (CLARITIN) tablet 10 mg  10 mg Oral Daily Garlon Hatchet, PA-C   10 mg at 01/12/18 0935  . polyethylene glycol (MIRALAX / GLYCOLAX) packet 17 g  17 g Oral Daily Garlon Hatchet, PA-C   17 g at 01/12/18 0936  . propranolol (INDERAL) tablet 40 mg  40 mg Oral BID Garlon Hatchet, PA-C   40 mg at 01/12/18 0935  . QUEtiapine (SEROQUEL XR) 24 hr tablet 400 mg  400 mg Oral BID Garlon Hatchet,  PA-C   400 mg at 01/12/18 1610   Current Outpatient Medications  Medication Sig Dispense Refill  . Cholecalciferol (VITAMIN D3 PO) Take 2,000 tablets by mouth every morning.    . clomiPRAMINE (ANAFRANIL) 50 MG capsule Take 50 mg by mouth at bedtime.    . diphenhydrAMINE (BENADRYL) 25 mg capsule Take 25 mg by mouth 3 (three) times daily.    Marland Kitchen loratadine (CLARITIN) 10 MG tablet Take 10 mg by mouth daily.    . polyethylene glycol powder (MIRALAX) powder Take 17 g by mouth daily. 255 g 0  . propranolol (INDERAL) 40 MG tablet Take 40 mg by mouth 2 (two) times daily.    . QUEtiapine (SEROQUEL XR) 400 MG 24 hr tablet Take 400 mg by mouth 2 (two) times daily.     . Salicylic Acid (ACNE WASH EX) Apply topically. SCRUB AFFECTED AREA(S) OF FACE TWICE DAILY    . cloNIDine (CATAPRES) 0.1 MG tablet Take 1 tablet (0.1 mg total) by mouth 2 (two) times daily. (Patient not taking: Reported on 12/14/2017) 60 tablet 2  . OLANZapine zydis (ZYPREXA ZYDIS) 5 MG disintegrating tablet Take 1 tablet (5 mg total) by mouth 2 (two) times daily. (Patient not taking: Reported on 12/14/2017) 10 tablet 0  . polyethylene glycol powder (MIRALAX) powder Take 17 g by mouth daily. (Patient not taking: Reported on 12/14/2017) 255 g 0  . risperiDONE (RISPERDAL) 0.5 MG tablet Take 1 tablet (0.5 mg total) by mouth 2 (two) times daily. (Patient not taking: Reported on 12/14/2017) 60 tablet 1  . risperiDONE (RISPERDAL) 0.5 MG tablet Take 1 tablet (0.5 mg total) by mouth 2 (two) times daily. (Patient not taking: Reported on 12/14/2017) 60 tablet 0  . risperiDONE (RISPERDAL) 0.5 MG tablet Take 1 tablet (0.5 mg total) by mouth 2 (two) times daily. (Patient not taking: Reported on 12/14/2017) 60 tablet 0   Musculoskeletal: Strength & Muscle Tone: within normal limits Gait & Station: normal Patient leans: N/A  Psychiatric Specialty Exam: Physical Exam  Constitutional: He appears well-developed and well-nourished.  HENT:  Head: Normocephalic  and atraumatic.  Neck: Normal range of motion.  Respiratory: Effort normal.  Musculoskeletal: Normal range of motion.  Neurological: He is alert.  Psychiatric: He is hyperactive. He expresses impulsivity.  Patient is nonverbal.     ROS  Blood pressure 101/66, pulse 79, temperature 97.6 F (36.4 C), temperature source Axillary, resp. rate 20, SpO2 100 %.There is no height or weight on file to calculate BMI.  General Appearance: Casual  Eye Contact:  Poor  Speech:  non verbal  Volume:  non verbal  Mood:  Calm today  Affect:  Congruent  Thought Process:  NA  Orientation:  Other:  self  Thought Content:  non verbal  Suicidal Thoughts:  non verbal  Homicidal Thoughts:  non verbal  Memory:  NA  Judgement:  Other:  severe IDD  Insight:  severe IDD  Psychomotor Activity:  Increased  Concentration:  Concentration: severe IDD and Attention Span: severe IDD  Recall:  severe IDD  Fund of Knowledge:  severe IDD  Language:  Non verbal  Akathisia:  No  Handed:  Right  AIMS (if indicated):   N/A  Assets:  Financial Resources/Insurance Housing  ADL's:  Intact  Cognition:  Impaired,  Severe  Sleep:   N/A     Demographic Factors:  Male, Low socioeconomic status and lives in group home, severe IDD  Loss Factors: NA  Historical Factors: NA  Risk Reduction Factors:   Living with another person, especially a relative and Positive social support  Continued Clinical Symptoms:  Severe Anxiety and/or Agitation  Cognitive Features That Contribute To Risk:  Loss of executive function    Suicide Risk:  Minimal: No identifiable suicidal ideation.  Patients presenting with no risk factors but with morbid ruminations; may be classified as minimal risk based on the severity of the depressive symptoms    Plan Of Care/Follow-up recommendations:  Activity:  as tolerated Diet:  Heart Healthy  Disposition: Aggression and anxiety: Take all medications as prescribed by your outpatient  provider. Keep all follow-up appointments as scheduled with your psychiatrist at [facility].  Do not consume alcohol or use illegal drugs while on prescription medications. Report any adverse effects from your medications to your primary care provider promptly.  In the event of recurrent symptoms or worsening symptoms, call 911, a crisis hotline, or go to the nearest emergency department for evaluation.   Laveda Abbe, NP 01/12/2018, 12:27 PM   Patient seen face-to-face for psychiatric evaluation, chart reviewed and case discussed with the physician extender and developed treatment plan. Reviewed the information documented and agree with the treatment plan.  Juanetta Beets, DO 01/12/18 6:23 PM

## 2018-01-12 NOTE — BH Assessment (Signed)
[facility] Assessment Progress Note  Per Juanetta Beets, DO, this pt does not require psychiatric hospitalization at this time.  Pt presents under IVC initiated by staff at pt's residential facility, which Dr Sharma Covert initially upheld, but which she has now rescinded.  Pt is to be discharged from [facility] to return to his residential facility.  Archie Balboa, LCSW agrees to facilitate this.  Discharge instruction advise pt to continue treatment with Thedore Mins, MD.  Pt's nurse has been notified.  Doylene Canning, MA Triage Specialist [phone]

## 2018-01-12 NOTE — Discharge Instructions (Signed)
For your behavioral health needs, you are advised to continue treatment with Mojeed Akintayo, MD: ° °     Mojeed Akintayo, MD °     [facility] °     [address] °     [place], [place] [address] °     [phone] °

## 2018-01-12 NOTE — BH Assessment (Addendum)
[facility] Assessment Progress Note  Per Hope at [facility], they currently have a bed available on their IDD unit, but they also have several referrals for it.  She invites this Clinical research associate, nonetheless, to send referral.  Referral has been faxed with final decision pending as of this writing.  Doylene Canning, [place] Behavioral Health Coordinator [phone]   Addendum:  At 11:45 Hope calls back from [facility].  She reports that, due to pt's level of functioning, he would be unable to program on their IDD unit.  Their physician has therefore declined pt for admission to their facility.  Juanetta Beets, DO has been notified.  Doylene Canning, [place] Behavioral Health Coordinator [phone]

## 2018-02-07 ENCOUNTER — Encounter (HOSPITAL_COMMUNITY): Payer: Self-pay | Admitting: Emergency Medicine

## 2018-02-07 ENCOUNTER — Other Ambulatory Visit: Payer: Self-pay

## 2018-02-07 ENCOUNTER — Emergency Department (HOSPITAL_COMMUNITY)
Admission: EM | Admit: 2018-02-07 | Discharge: 2018-02-07 | Disposition: A | Discharge status: Home / Self Care | Payer: Medicaid Other | Attending: Emergency Medicine | Admitting: Emergency Medicine

## 2018-02-07 DIAGNOSIS — F840 Autistic disorder: Secondary | ICD-10-CM | POA: Diagnosis not present

## 2018-02-07 DIAGNOSIS — Z79899 Other long term (current) drug therapy: Secondary | ICD-10-CM | POA: Diagnosis not present

## 2018-02-07 DIAGNOSIS — R4689 Other symptoms and signs involving appearance and behavior: Secondary | ICD-10-CM

## 2018-02-07 DIAGNOSIS — F73 Profound intellectual disabilities: Secondary | ICD-10-CM | POA: Insufficient documentation

## 2018-02-07 LAB — CBC WITH DIFFERENTIAL/PLATELET
Abs Immature Granulocytes: 0.03 10*3/uL (ref 0.00–0.07)
BASOS PCT: 1 %
Basophils Absolute: 0 10*3/uL (ref 0.0–0.1)
Eosinophils Absolute: 0.1 10*3/uL (ref 0.0–0.5)
Eosinophils Relative: 1 %
HEMATOCRIT: 41.5 % (ref 39.0–52.0)
HEMOGLOBIN: 13.8 g/dL (ref 13.0–17.0)
Immature Granulocytes: 0 %
LYMPHS ABS: 1.9 10*3/uL (ref 0.7–4.0)
Lymphocytes Relative: 23 %
MCH: 33.2 pg (ref 26.0–34.0)
MCHC: 33.3 g/dL (ref 30.0–36.0)
MCV: 99.8 fL (ref 80.0–100.0)
MONO ABS: 0.5 10*3/uL (ref 0.1–1.0)
MONOS PCT: 6 %
NEUTROS ABS: 5.6 10*3/uL (ref 1.7–7.7)
Neutrophils Relative %: 69 %
PLATELETS: 178 10*3/uL (ref 150–400)
RBC: 4.16 MIL/uL — ABNORMAL LOW (ref 4.22–5.81)
RDW: 12.4 % (ref 11.5–15.5)
WBC: 8.2 10*3/uL (ref 4.0–10.5)
nRBC: 0 % (ref 0.0–0.2)

## 2018-02-07 LAB — COMPREHENSIVE METABOLIC PANEL
ALT: 75 U/L — ABNORMAL HIGH (ref 0–44)
AST: 40 U/L (ref 15–41)
Albumin: 4.4 g/dL (ref 3.5–5.0)
Alkaline Phosphatase: 108 U/L (ref 38–126)
Anion gap: 6 (ref 5–15)
BUN: 20 mg/dL (ref 6–20)
CHLORIDE: 104 mmol/L (ref 98–111)
CO2: 28 mmol/L (ref 22–32)
CREATININE: 0.88 mg/dL (ref 0.61–1.24)
Calcium: 9.2 mg/dL (ref 8.9–10.3)
GFR calc Af Amer: >60 mL/min (ref >60)
GLUCOSE: 74 mg/dL (ref 70–99)
Potassium: 3.8 mmol/L (ref 3.5–5.1)
Sodium: 138 mmol/L (ref 135–145)
TOTAL PROTEIN: 6.9 g/dL (ref 6.5–8.1)
Total Bilirubin: 0.5 mg/dL (ref 0.3–1.2)

## 2018-02-07 LAB — SALICYLATE LEVEL: Salicylate Lvl: <7 mg/dL (ref 2.8–30.0)

## 2018-02-07 LAB — ACETAMINOPHEN LEVEL: Acetaminophen (Tylenol), Serum: <10 ug/mL — ABNORMAL LOW (ref 10–30)

## 2018-02-07 LAB — ETHANOL

## 2018-02-07 NOTE — BH Assessment (Signed)
[facility]  Per Juanetta BeetsJacqueline Norman, DO, this pt does not require psychiatric hospitalization at this time.  [facility] to continue treatment with Thedore MinsMojeed Akintayo, MD at the [facility].  This has been included in pt's discharge instructions.  Archie BalboaMackenzie Irwin, LCSW agrees to facilitate pt's return to the community.  Pt's nurse has been notified.  Doylene Canninghomas Lian Pounds, MA Triage Specialist [phone]

## 2018-02-07 NOTE — ED Notes (Signed)
Bed: [id number] Expected date:  Expected time:  Means of arrival:  Comments: EMS autistic male- psych eval- from group home

## 2018-02-07 NOTE — Progress Notes (Signed)
CSW aware patient has been psychiatrically and medically cleared for discharge. CS[facility] CSW spoke with Colon FlatteryLeon Griffin, group home owner [phone], regarding patient disposition. Per Shon HaleLeon, he will be her to pick up patient by 3pm. CSW to update patient's RN.   Archie BalboaMackenzie Irwin, LCSWA  Clinical Social Work Department  [facility]  [phone]
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
## 2018-02-07 NOTE — ED Notes (Signed)
Pt given Sprite. Pt ate entire lunch tray.
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
## 2018-02-07 NOTE — Discharge Instructions (Addendum)
For your behavioral health needs, you are advised to continue treatment with Mojeed Akintayo, MD at the [facility]: ° °     [facility] °     [address] °     [place], [place] [address] °     [phone] °

## 2018-02-07 NOTE — ED Notes (Signed)
Group Home worker: Colon FlatteryLeon Griffin [phone]  Pt's mother: Vilinda BoehringerRosemary Perdue [phone]

## 2020-01-12 IMAGING — CR DG ABDOMEN 1V
2 series · 2 of 2 positions shown · non-contrast
Comparison: None.

CLINICAL DATA: No bowel movement for 1 week, initial encounter

EXAM:
ABDOMEN - 1 VIEW

[w abdomen upright (1 of 2)]
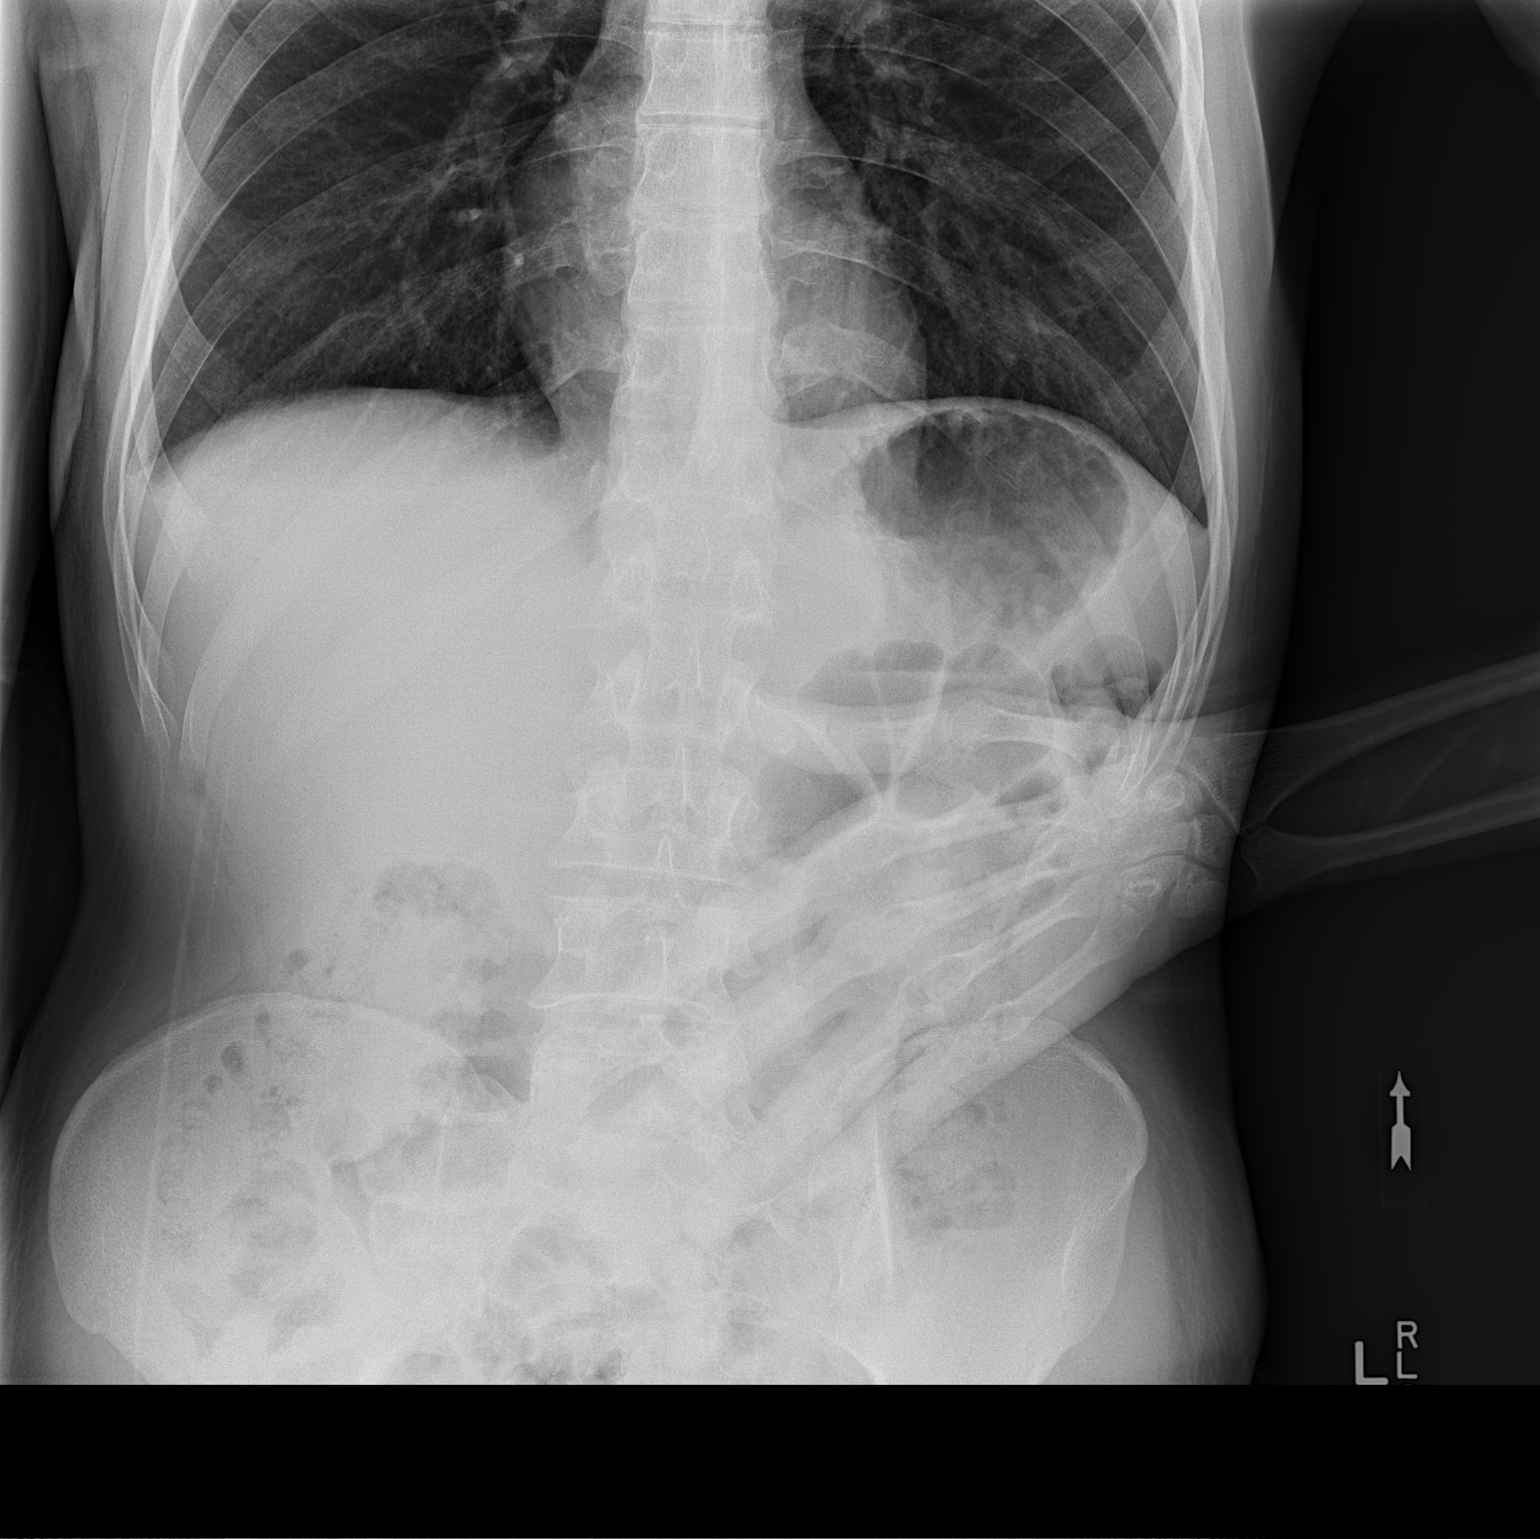

[w abdomen upright (2 of 2)]
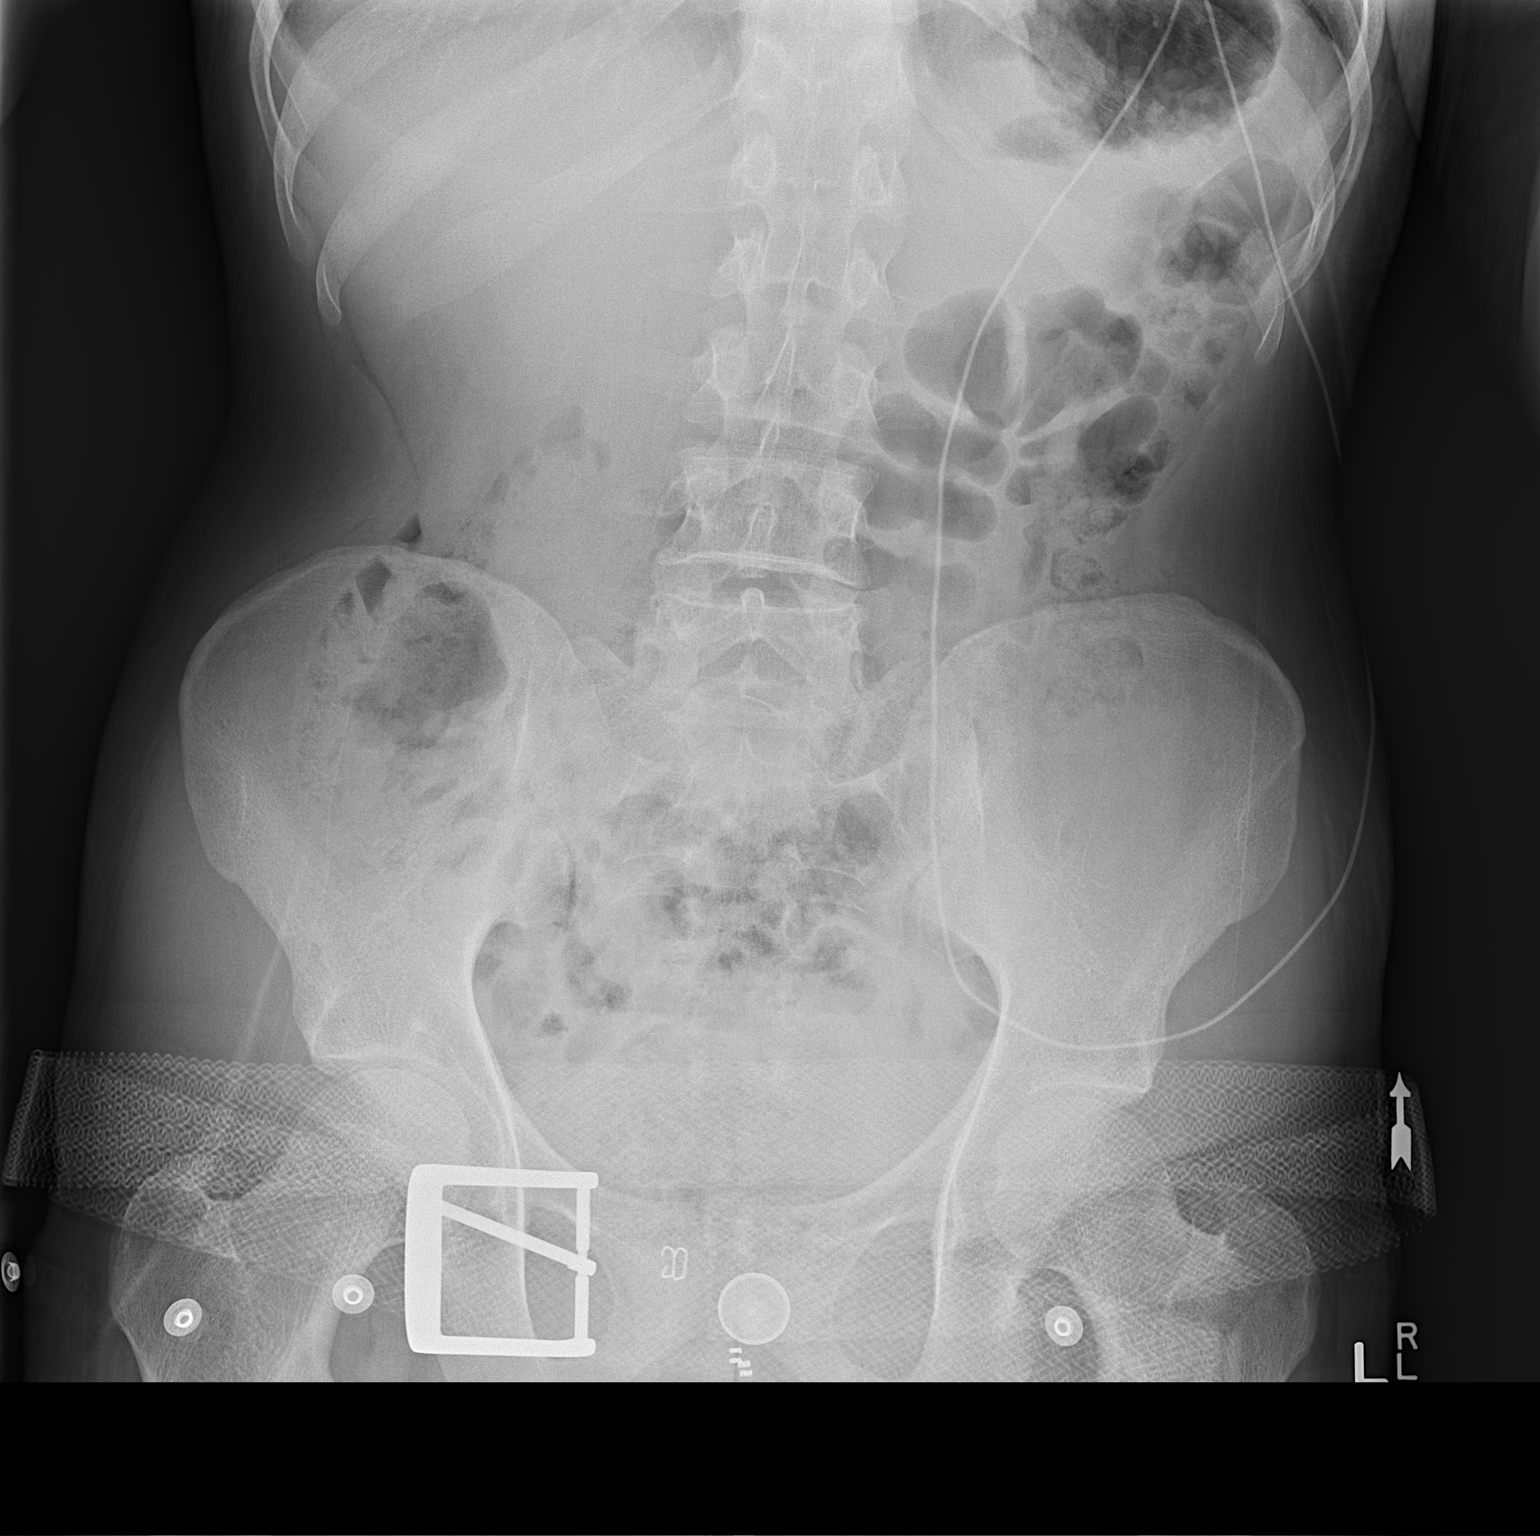

[2 of 2 positions shown; findings below may reference images not displayed]

FINDINGS: Scattered large and small bowel gas is noted. Mild retained fecal
material is noted although no significant obstructive change is
seen. No bony abnormality is noted. No free air is seen.
IMPRESSION: No definitive obstruction or constipation.
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
